# Patient Record
Sex: Male | Born: 1960 | Race: White | Hispanic: No | Marital: Married | Smoking: Never smoker
Health system: Southern US, Community
[De-identification: ages and names within clinical notes are randomized; demographics above are authoritative.]

## PROBLEM LIST (undated history)

## (undated) DIAGNOSIS — E785 Hyperlipidemia, unspecified: Secondary | ICD-10-CM

## (undated) DIAGNOSIS — K219 Gastro-esophageal reflux disease without esophagitis: Secondary | ICD-10-CM

## (undated) HISTORY — DX: Hyperlipidemia, unspecified: E78.5

## (undated) HISTORY — PX: PATELLAR TENDON REPAIR: SHX737

## (undated) HISTORY — PX: OTHER SURGICAL HISTORY: SHX169

## (undated) HISTORY — DX: Gastro-esophageal reflux disease without esophagitis: K21.9

---

## 2005-08-30 ENCOUNTER — Ambulatory Visit: Payer: Self-pay | Admitting: Family Medicine

## 2005-09-08 ENCOUNTER — Ambulatory Visit: Payer: Self-pay | Admitting: Family Medicine

## 2007-02-15 ENCOUNTER — Emergency Department (HOSPITAL_COMMUNITY): Admission: EM | Admit: 2007-02-15 | Discharge: 2007-02-15 | Payer: Self-pay | Admitting: Pediatrics

## 2011-05-08 ENCOUNTER — Emergency Department (HOSPITAL_COMMUNITY): Payer: Self-pay

## 2011-05-08 ENCOUNTER — Observation Stay (HOSPITAL_COMMUNITY)
Admission: EM | Admit: 2011-05-08 | Discharge: 2011-05-08 | Disposition: A | Discharge status: Home / Self Care | Payer: Self-pay | Attending: Emergency Medicine | Admitting: Emergency Medicine

## 2011-05-08 DIAGNOSIS — M5430 Sciatica, unspecified side: Secondary | ICD-10-CM | POA: Insufficient documentation

## 2011-05-08 DIAGNOSIS — M545 Low back pain, unspecified: Principal | ICD-10-CM | POA: Insufficient documentation

## 2011-05-08 DIAGNOSIS — IMO0002 Reserved for concepts with insufficient information to code with codable children: Secondary | ICD-10-CM | POA: Insufficient documentation

## 2014-02-14 ENCOUNTER — Encounter: Payer: Self-pay | Admitting: Internal Medicine

## 2014-02-14 ENCOUNTER — Ambulatory Visit (INDEPENDENT_AMBULATORY_CARE_PROVIDER_SITE_OTHER): Payer: Self-pay | Admitting: Internal Medicine

## 2014-02-14 VITALS — BP 134/85 | HR 72 | Temp 98.3°F | Wt 189.0 lb

## 2014-02-14 DIAGNOSIS — L0390 Cellulitis, unspecified: Secondary | ICD-10-CM

## 2014-02-14 DIAGNOSIS — E785 Hyperlipidemia, unspecified: Secondary | ICD-10-CM

## 2014-02-14 DIAGNOSIS — K219 Gastro-esophageal reflux disease without esophagitis: Secondary | ICD-10-CM | POA: Insufficient documentation

## 2014-02-14 DIAGNOSIS — L0291 Cutaneous abscess, unspecified: Secondary | ICD-10-CM

## 2014-02-14 MED ORDER — DOXYCYCLINE HYCLATE 100 MG PO TABS: 100.0000 mg | ORAL_TABLET | Freq: Two times a day (BID) | ORAL | Status: DC

## 2014-02-14 MED ORDER — MUPIROCIN 2 % EX OINT: 1.0000 "application " | TOPICAL_OINTMENT | Freq: Two times a day (BID) | CUTANEOUS | Status: DC

## 2014-02-14 NOTE — Patient Instructions (Signed)
Take antibiotics as prescribed   Use CHLOREXIDINE 2 OR 4 %:  apply to wet skin daily for 7 days;  from the neck to toes particularly at the skin folds, rinse after 1 minute (shower)  Mupirocin nasal ointment twice a day for one week  Apply OTC hydrocortisone as needed and itchy skin  Call anytime if you've worse, have fever chills or you are not improving in the next 2 or 3 days

## 2014-02-14 NOTE — Progress Notes (Signed)
   Subjective:    Patient ID: Lee Beltran, male    DOB: 1961-09-25, 53 y.o.   MRN: [id number]  DOS:  02/14/2014 Type of  visit: New patient, acute visit, has a CPX schedule for a later time Has a history of boils, usually once a month, recently had one close to the scrotum, it came to a head 4 days ago and is feeling better however 2 days ago developed another in the lower abdomen and he is concerned. Also has developed mild itching at the suprapubic area bilaterally  ROS Denies fever or chills No  nausea, vomiting. Occasional diarrhea which is a chronic problem, no blood in the stools.  Past Medical History  Diagnosis Date  . GERD (gastroesophageal reflux disease)   . Hyperlipidemia     Past Surgical History  Procedure Laterality Date  . Patellar tendon repair    . Face fracture  1990s    has a plate R cheek    History   Social History  . Marital Status: Married    Spouse Name: N/A    Number of Children: 1  . Years of Education: N/A   Occupational History  . logger     Social History Main Topics  . Smoking status: Never Smoker   . Smokeless tobacco: Never Used  . Alcohol Use: No  . Drug Use: No  . Sexual Activity: Not on file   Other Topics Concern  . Not on file   Social History Narrative  . No narrative on file     Family History  Problem Relation Age of Onset  . CAD Father     F MI age 54, M defibrilator   . Diabetes Brother      B and mother   . Breast cancer Sister   . Esophageal cancer Brother       Medication List       This list is accurate as of: 02/14/14 11:59 PM.  Always use your most recent med list.               doxycycline 100 MG tablet  Commonly known as:  VIBRA-TABS  Take 1 tablet (100 mg total) by mouth 2 (two) times daily.     mupirocin ointment 2 %  Commonly known as:  BACTROBAN  Place 1 application into the nose 2 (two) times daily.           Objective:   Physical Exam  Abdominal:     BP 134/85  Pulse 72   Temp(Src) 98.3 F (36.8 C)  Wt 189 lb (85.73 kg)  SpO2 97%  General -- alert, well-developed, NAD.   Lungs -- normal respiratory effort, no intercostal retractions, no accessory muscle use, and normal breath sounds.  Heart-- normal rate, regular rhythm, no murmur.  Abdomen-- Not distended, good bowel sounds,soft, non-tender. Skin--see graphic. Also at the suprapubic area there is a very mild superficial redness without obvious abscess or cellulitis. GU-- Has a 4 mm induration between the scrotum and rectum were the previous boil was, no discharge, redness   Extremities-- no pretibial edema bilaterally  Neurologic--  alert & oriented X3. Speech normal, gait normal, strength normal in all extremities.  Psych-- Cognition and judgment appear intact. Cooperative with normal attention span and concentration. No anxious or depressed appearing.       Assessment & Plan:

## 2014-02-14 NOTE — Progress Notes (Signed)
Pre visit review using our clinic review tool, if applicable. No additional management support is needed unless otherwise documented below in the visit note. 

## 2014-02-15 ENCOUNTER — Encounter: Payer: Self-pay | Admitting: Internal Medicine

## 2014-02-15 DIAGNOSIS — L0390 Cellulitis, unspecified: Secondary | ICD-10-CM | POA: Insufficient documentation

## 2014-02-15 NOTE — Assessment & Plan Note (Signed)
Cellulitis, Presents with localized cellulitis at the abdomen, history of on and off boils for the last year. Vascular exam is normal, no murmur, not running fever. Plan: Doxycycline Suspect MRSA, Will try to eradicate it. See instructions

## 2014-04-04 ENCOUNTER — Ambulatory Visit: Payer: Self-pay | Admitting: Internal Medicine

## 2014-04-14 ENCOUNTER — Telehealth: Payer: Self-pay | Admitting: *Deleted

## 2014-04-14 NOTE — Telephone Encounter (Signed)
Please call pt wife, Kendal HymenBonnie at [phone].  bw

## 2014-04-14 NOTE — Telephone Encounter (Signed)
Medication List and allergies:  Reviewed and updated   Local prescriptions: [facility] [address] [place]  Immunizations Due: Tdap/Pna  A/P FH, PSH, or Personal Hx: Reviewed and updated Flu vaccine: Does not take flu vaccine Tdap:Unable to remember last one PNA: Never Shingles: Never CCS: Never PSA: Never  To Discuss with Provider: Rash on his face

## 2014-04-14 NOTE — Telephone Encounter (Signed)
Left message for the patient to return my call regarding upcoming appt.

## 2014-04-15 ENCOUNTER — Encounter: Payer: Self-pay | Admitting: Internal Medicine

## 2014-04-15 ENCOUNTER — Ambulatory Visit (INDEPENDENT_AMBULATORY_CARE_PROVIDER_SITE_OTHER): Payer: Self-pay | Admitting: Internal Medicine

## 2014-04-15 VITALS — BP 145/79 | HR 68 | Temp 98.7°F | Resp 16 | Ht 71.25 in | Wt 190.5 lb

## 2014-04-15 DIAGNOSIS — L03319 Cellulitis of trunk, unspecified: Secondary | ICD-10-CM

## 2014-04-15 DIAGNOSIS — Z23 Encounter for immunization: Secondary | ICD-10-CM

## 2014-04-15 DIAGNOSIS — Z0000 Encounter for general adult medical examination without abnormal findings: Secondary | ICD-10-CM

## 2014-04-15 MED ORDER — TRIAMCINOLONE ACETONIDE 0.1 % EX LOTN: 1.0000 "application " | TOPICAL_LOTION | Freq: Two times a day (BID) | CUTANEOUS | Status: DC | PRN

## 2014-04-15 NOTE — Patient Instructions (Signed)
Please come back fasting: CMP, CBC, TSH, FLP, PSA --- dx v70  Next visit in one year

## 2014-04-15 NOTE — Progress Notes (Signed)
Subjective:    Patient ID: Lee Beltran, male    DOB: 08-07-1961, 53 y.o.   MRN: [id number]  DOS:  04/15/2014 Type of  Visit: CPX History: feeling well   ROS Diet-- healthy for the most part, no fast food  Exercise-- very active at work No  CP, SOB No palpitations  Denies  nausea, vomiting diarrhea, blood in the stools  (-) cough, sputum production (-) wheezing, chest congestion No dysuria, gross hematuria, difficulty urinating  + stress, occ  anxiety, no depression    Past Medical History  Diagnosis Date  . GERD (gastroesophageal reflux disease)   . Hyperlipidemia     Past Surgical History  Procedure Laterality Date  . Patellar tendon repair    . Face fracture  1990s    has a plate R cheek    History   Social History  . Marital Status: Married    Spouse Name: N/A    Number of Children: 1  . Years of Education: N/A   Occupational History  . logger     Social History Main Topics  . Smoking status: Never Smoker   . Smokeless tobacco: Never Used  . Alcohol Use: No  . Drug Use: No  . Sexual Activity: Not on file   Other Topics Concern  . Not on file   Social History Narrative  . No narrative on file     Family History  Problem Relation Age of Onset  . CAD Father     F MI age 54, M defibrilator   . Diabetes Brother      B and mother   . Breast cancer Sister   . Esophageal cancer Brother   . Colon cancer Neg Hx   . Prostate cancer Neg Hx        Medication List       This list is accurate as of: 04/15/14  5:48 PM.  Always use your most recent med list.               CHOLESTEROL DEFENSE PO  Take 1 tablet by mouth daily.     HEALTHY HEART COMPLEX PO  Take 1 tablet by mouth daily.     multivitamin with minerals tablet  Take 1 tablet by mouth daily.     triamcinolone lotion 0.1 %  Commonly known as:  KENALOG  Apply 1 application topically 2 (two) times daily as needed.           Objective:   Physical Exam BP 145/79  Pulse 68   Temp(Src) 98.7 F (37.1 C) (Oral)  Resp 16  Ht 5' 11.25" (1.81 m)  Wt 190 lb 8 oz (86.41 kg)  BMI 26.38 kg/m2  SpO2 97% General -- alert, well-developed, NAD.  Neck --no thyromegaly , normal carotid pulse  HEENT-- Not pale.  Lungs -- normal respiratory effort, no intercostal retractions, no accessory muscle use, and normal breath sounds.  Heart-- normal rate, regular rhythm, no murmur.  Abdomen-- Not distended, good bowel sounds,soft, non-tender. No rebound or rigidity. No mass,organomegaly. Rectal-- No external abnormalities noted. Normal sphincter tone. No rectal masses or tenderness. No stool found Prostate--Prostate gland firm and smooth, no enlargement, nodularity, tenderness, mass, asymmetry or induration. Extremities-- no pretibial edema bilaterally  Neurologic--  alert & oriented X3. Speech normal, gait appropriate for age, strength symmetric and appropriate for age.   Psych-- Cognition and judgment appear intact. Cooperative with normal attention span and concentration. No anxious or depressed appearing.  Assessment & Plan:

## 2014-04-15 NOTE — Progress Notes (Signed)
Pre visit review using our clinic review tool, if applicable. No additional management support is needed unless otherwise documented below in the visit note. 

## 2014-04-15 NOTE — Assessment & Plan Note (Signed)
Resolved

## 2014-04-15 NOTE — Assessment & Plan Note (Addendum)
Td today +FH oc CAD, Patient is asymptomatic, plan is control his CV risk factors. EKG--Unable to obtain, machine  down. Will get EKG when he comes back for blood work Diet and exercise discussed Labs  Never had a cscope , discussed cscope vs IFOB, elected IFOB, will call if decides to do a Cscope  Also, having itching around the neck, related to sweating? Will prescribe Kenalog lotion to use as needed. If not better will let me know

## 2014-04-18 ENCOUNTER — Other Ambulatory Visit: Payer: Self-pay | Admitting: Internal Medicine

## 2014-04-18 ENCOUNTER — Other Ambulatory Visit (INDEPENDENT_AMBULATORY_CARE_PROVIDER_SITE_OTHER): Payer: Self-pay

## 2014-04-18 ENCOUNTER — Ambulatory Visit: Payer: Self-pay

## 2014-04-18 DIAGNOSIS — Z0000 Encounter for general adult medical examination without abnormal findings: Secondary | ICD-10-CM

## 2014-04-18 LAB — CBC
HCT: 40.5 % (ref 39.0–52.0)
Hemoglobin: 14.2 g/dL (ref 13.0–17.0)
MCHC: 35 g/dL (ref 30.0–36.0)
MCV: 89 fl (ref 78.0–100.0)
Platelets: 209 K/uL (ref 150.0–400.0)
RBC: 4.55 Mil/uL (ref 4.22–5.81)
RDW: 13.1 % (ref 11.5–15.5)
WBC: 6.4 K/uL (ref 4.0–10.5)

## 2014-04-18 LAB — LIPID PANEL
Cholesterol: 220 mg/dL — ABNORMAL HIGH (ref 0–200)
HDL: 29 mg/dL — ABNORMAL LOW
LDL Cholesterol: 27 mg/dL (ref 0–99)
NonHDL: 191
Total CHOL/HDL Ratio: 8
Triglycerides: 822 mg/dL — ABNORMAL HIGH (ref 0.0–149.0)
VLDL: 164.4 mg/dL — ABNORMAL HIGH (ref 0.0–40.0)

## 2014-04-18 LAB — COMPREHENSIVE METABOLIC PANEL
ALK PHOS: 62 U/L (ref 39–117)
ALT: 31 U/L (ref 0–53)
AST: 27 U/L (ref 0–37)
Albumin: 4.2 g/dL (ref 3.5–5.2)
BILIRUBIN TOTAL: 0.5 mg/dL (ref 0.2–1.2)
BUN: 20 mg/dL (ref 6–23)
CO2: 27 mEq/L (ref 19–32)
Calcium: 9.1 mg/dL (ref 8.4–10.5)
Chloride: 106 mEq/L (ref 96–112)
Creatinine, Ser: 1 mg/dL (ref 0.4–1.5)
GFR: 84.88 mL/min (ref >60.00)
GLUCOSE: 92 mg/dL (ref 70–99)
Potassium: 4.2 mEq/L (ref 3.5–5.1)
SODIUM: 138 meq/L (ref 135–145)
Total Protein: 7.5 g/dL (ref 6.0–8.3)

## 2014-04-18 LAB — TSH: TSH: 1.62 u[IU]/mL (ref 0.35–4.50)

## 2014-04-18 LAB — PSA: PSA: 0.79 ng/mL (ref 0.10–4.00)

## 2014-04-18 NOTE — Patient Instructions (Signed)
Patient in the office for EKG, machine was down at the time of CPE.

## 2014-04-22 ENCOUNTER — Other Ambulatory Visit: Payer: Self-pay | Admitting: General Practice

## 2014-04-22 DIAGNOSIS — E785 Hyperlipidemia, unspecified: Secondary | ICD-10-CM

## 2014-04-22 MED ORDER — FENOFIBRATE 160 MG PO TABS: 160.0000 mg | ORAL_TABLET | Freq: Every day | ORAL | Status: DC

## 2014-04-29 ENCOUNTER — Other Ambulatory Visit (INDEPENDENT_AMBULATORY_CARE_PROVIDER_SITE_OTHER): Payer: Self-pay

## 2014-04-29 DIAGNOSIS — Z0000 Encounter for general adult medical examination without abnormal findings: Secondary | ICD-10-CM

## 2014-04-29 LAB — FECAL OCCULT BLOOD, IMMUNOCHEMICAL: FECAL OCCULT BLD: NEGATIVE

## 2014-04-30 ENCOUNTER — Encounter: Payer: Self-pay | Admitting: *Deleted

## 2014-07-27 ENCOUNTER — Telehealth: Payer: Self-pay | Admitting: Internal Medicine

## 2014-07-27 DIAGNOSIS — E785 Hyperlipidemia, unspecified: Secondary | ICD-10-CM

## 2014-07-27 NOTE — Telephone Encounter (Signed)
Call  patient, due for labs, recently started a cholesterol medication. Arrange for at FLP, AST, ALT----------- dx  dyslipidemia

## 2014-07-28 NOTE — Telephone Encounter (Signed)
Spoke with Pts wife, informed her that Pt needs FLP, AST, and ALT drawn. She stated she would have Pt call and make appt.

## 2014-08-12 NOTE — Telephone Encounter (Signed)
Lab appt has not been scheduled, letter printed and mailed to Pt informing him to call and schedule lab appt at his earliest convenience.

## 2015-04-10 ENCOUNTER — Telehealth: Payer: Self-pay | Admitting: Internal Medicine

## 2015-04-10 NOTE — Telephone Encounter (Signed)
Please advise 

## 2015-04-10 NOTE — Telephone Encounter (Signed)
Spoke with Kendal Hymen, Pt's wife, informed her of Dr. Drue Novel recommendations. Informed Kendal Hymen that our [place] location has Saturday clinic's from either 9 to 12 or 9 to 1, gave her their number to call in the morning to schedule an acute visit. Kendal Hymen verbalized understanding.

## 2015-04-10 NOTE — Telephone Encounter (Signed)
Caller name: Domenik Rumpel Relationship to patient: wife Can be reached: [phone], cell # for [facility]:  Reason for call: Wants to know if she can put castor oil on boils pt has near his genitals. Pt is not wanting to take time to come in for an appt. 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 Has about 6 large boils, quarter size per his wife. States previously prescribed antibiotic for it and understand one will not be called in without having a visit.

## 2015-04-10 NOTE — Telephone Encounter (Signed)
Recommend to be seen for treatment either at one of our offices or at the urgent care. Recommend warm compress and avoid castor oil

## 2015-07-13 ENCOUNTER — Ambulatory Visit (INDEPENDENT_AMBULATORY_CARE_PROVIDER_SITE_OTHER): Payer: Self-pay

## 2015-07-13 ENCOUNTER — Encounter: Payer: Self-pay | Admitting: Podiatry

## 2015-07-13 ENCOUNTER — Ambulatory Visit (INDEPENDENT_AMBULATORY_CARE_PROVIDER_SITE_OTHER): Payer: Self-pay | Admitting: Podiatry

## 2015-07-13 VITALS — BP 152/89 | HR 75 | Resp 16 | Ht 70.0 in | Wt 185.0 lb

## 2015-07-13 DIAGNOSIS — M79675 Pain in left toe(s): Secondary | ICD-10-CM

## 2015-07-13 DIAGNOSIS — L600 Ingrowing nail: Secondary | ICD-10-CM

## 2015-07-13 NOTE — Progress Notes (Signed)
   Subjective:    Patient ID: Lee Beltran, male    DOB: 30-Jul-1961, 54 y.o.   MRN: [id number]  HPI Patient presents with a nail problem in their left foot, pinky toe. Left foot, Possibly ingrown. Pt has soaked toe in salt water with no relief. This has been going on for the past 2 weeks. Pt stated, toe nail "oozed in the first week".   Review of Systems  All other systems reviewed and are negative.      Objective:   Physical Exam        Assessment & Plan:

## 2015-07-13 NOTE — Patient Instructions (Signed)

## 2015-07-13 NOTE — Progress Notes (Signed)
Subjective:     Patient ID: Lee Beltran, male   DOB: 1961/02/12, 54 y.o.   MRN: [id number]  HPI patient presents stating my left little toe has been very tender and making it hard for me to wear shoe gear and I don't remember an injury   Review of Systems  All other systems reviewed and are negative.      Objective:   Physical Exam  Constitutional: He is oriented to person, place, and time.  Cardiovascular: Intact distal pulses.   Musculoskeletal: Normal range of motion.  Neurological: He is oriented to person, place, and time.  Skin: Skin is warm.  Nursing note and vitals reviewed.  her vascular status intact muscle strength adequate range of motion within normal limits with patient noted to have a painful distal lateral aspect of the fifth toe left with both keratotic lesion and incurvated nailbed noted. Also rotation of the fifth toe is noted and I did see that the patient has good digital perfusion and is well oriented 3     Assessment:     Rotated fifth toe with distal lateral keratotic lesion and ingrown toenail that's very painful when pressed    Plan:     H&P condition reviewed with patient. At this point it's difficult to say between a problem of a lesion or a problem of nail and I explained both conditions to patient. I went ahead and first infiltrated 60 mg I can Marcaine mixture and then x-rayed the foot and did a sterile prep. I then remove the lateral nail bed exposed the matrix and applied phenol and debrided tissue and do think for the most part this appears to be nail condition even though there is a small bone spur. Reviewed x-rays with patient

## 2015-07-15 ENCOUNTER — Telehealth: Payer: Self-pay | Admitting: *Deleted

## 2015-07-15 NOTE — Telephone Encounter (Signed)
Called patient at [phone] (Home #) and talked to the patient's wife, Lee Beltran, regarding her husband's ingrown toenail procedure that was performed on Monday, July 13, 2015. She stated, "Toe felt fine". Pt has soaked his toe. Pt can't wear a regular shoe yet.

## 2016-10-19 ENCOUNTER — Ambulatory Visit (INDEPENDENT_AMBULATORY_CARE_PROVIDER_SITE_OTHER): Payer: Self-pay

## 2016-10-19 ENCOUNTER — Encounter (HOSPITAL_COMMUNITY): Payer: Self-pay | Admitting: Emergency Medicine

## 2016-10-19 ENCOUNTER — Ambulatory Visit (HOSPITAL_COMMUNITY)
Admission: EM | Admit: 2016-10-19 | Discharge: 2016-10-19 | Disposition: A | Discharge status: Home / Self Care | Payer: Self-pay | Attending: Family Medicine | Admitting: Family Medicine

## 2016-10-19 DIAGNOSIS — B9789 Other viral agents as the cause of diseases classified elsewhere: Secondary | ICD-10-CM

## 2016-10-19 DIAGNOSIS — J069 Acute upper respiratory infection, unspecified: Secondary | ICD-10-CM

## 2016-10-19 MED ORDER — IPRATROPIUM BROMIDE 0.06 % NA SOLN: 2.0000 | Freq: Four times a day (QID) | NASAL | 1 refills | Status: AC

## 2016-10-19 MED ORDER — HYDROCOD POLST-CPM POLST ER 10-8 MG/5ML PO SUER: 5.0000 mL | Freq: Two times a day (BID) | ORAL | 0 refills | Status: DC | PRN

## 2016-10-19 NOTE — ED Provider Notes (Signed)
[facility]    CSN: [id number] Arrival date & time: 10/19/16  1733     History   Chief Complaint Chief Complaint  Patient presents with  . Cough    HPI Lee Beltran is a 55 y.o. male.   The history is provided by the patient and the spouse.  Cough  Cough characteristics:  Non-productive and hacking Severity:  Moderate Onset quality:  Gradual Duration:  4 days Progression:  Unchanged Chronicity:  New Smoker: no   Context: sick contacts   Context comment:  Grandson with RSV pneumonia. Relieved by:  Nothing Associated symptoms: fever and sore throat   Associated symptoms: no rhinorrhea and no shortness of breath     Past Medical History:  Diagnosis Date  . GERD (gastroesophageal reflux disease)   . Hyperlipidemia     Patient Active Problem List   Diagnosis Date Noted  . Annual physical exam 04/15/2014  . Cellulitis 02/15/2014  . GERD (gastroesophageal reflux disease)   . Hyperlipidemia     Past Surgical History:  Procedure Laterality Date  . face fracture  1990s   has a plate R cheek  . PATELLAR TENDON REPAIR         Home Medications    Prior to Admission medications   Medication Sig Start Date End Date Taking? Authorizing Provider  chlorpheniramine-HYDROcodone (TUSSIONEX PENNKINETIC ER) 10-8 MG/5ML SUER Take 5 mLs by mouth every 12 (twelve) hours as needed for cough. 10/19/16   Linna HoffJames D Kema Santaella, MD  ipratropium (ATROVENT) 0.06 % nasal spray Place 2 sprays into both nostrils 4 (four) times daily. 10/19/16   Linna HoffJames D Madhuri Vacca, MD  Nutritional Supplements (CHOLESTEROL DEFENSE PO) Take 1 tablet by mouth daily.    Historical Provider, MD  Specialty Vitamins Products (HEALTHY HEART COMPLEX PO) Take 1 tablet by mouth daily.    Historical Provider, MD  UNABLE TO FIND Pt takes Shaklee, Nutriferon, 1 tablet a day-nutritional supplement    Historical Provider, MD    Family History Family History  Problem Relation Age of Onset  . CAD Father     F MI age  56, M defibrilator   . Diabetes Brother      B and mother   . Breast cancer Sister   . Esophageal cancer Brother   . Colon cancer Neg Hx   . Prostate cancer Neg Hx     Social History Social History  Substance Use Topics  . Smoking status: Never Smoker  . Smokeless tobacco: Never Used  . Alcohol use No     Allergies   Patient has no known allergies.   Review of Systems Review of Systems  Constitutional: Positive for fever.  HENT: Positive for sore throat. Negative for congestion, postnasal drip and rhinorrhea.   Respiratory: Positive for cough. Negative for shortness of breath.   Genitourinary: Negative.   Neurological: Negative.   All other systems reviewed and are negative.    Physical Exam Triage Vital Signs ED Triage Vitals  Enc Vitals Group     BP 10/19/16 1820 141/91     Pulse Rate 10/19/16 1820 93     Resp 10/19/16 1820 26     Temp 10/19/16 1820 99.6 F (37.6 C)     Temp Source 10/19/16 1820 Oral     SpO2 10/19/16 1820 97 %     Weight 10/19/16 1819 185 lb (83.9 kg)     Height 10/19/16 1819 5\' 11"  (1.803 m)     Head Circumference --  Peak Flow --      Pain Score 10/19/16 1821 7     Pain Loc --      Pain Edu? --      Excl. in GC? --    No data found.   Updated Vital Signs BP 141/91   Pulse 93   Temp 99.6 F (37.6 C) (Oral)   Resp 26   Ht 5\' 11"  (1.803 m)   Wt 185 lb (83.9 kg)   SpO2 97%   BMI 25.80 kg/m   Visual Acuity Right Eye Distance:   Left Eye Distance:   Bilateral Distance:    Right Eye Near:   Left Eye Near:    Bilateral Near:     Physical Exam  Constitutional: He is oriented to person, place, and time. He appears well-developed and well-nourished.  HENT:  Right Ear: External ear normal.  Left Ear: External ear normal.  Mouth/Throat: Oropharynx is clear and moist.  Cardiovascular: Normal rate.   Pulmonary/Chest: Effort normal and breath sounds normal.  Lymphadenopathy:    He has no cervical adenopathy.    Neurological: He is alert and oriented to person, place, and time.  Skin: Skin is warm and dry.  Nursing note and vitals reviewed.    UC Treatments / Results  Labs (all labs ordered are listed, but only abnormal results are displayed) Labs Reviewed - No data to display  EKG  EKG Interpretation None       Radiology Dg Chest 2 View  Result Date: 10/19/2016 CLINICAL DATA:  Cough and fever EXAM: CHEST  2 VIEW COMPARISON:  None. FINDINGS: The heart size and mediastinal contours are within normal limits. Both lungs are clear. Nodular density at the left lung base consistent with the patient's left nipple shadow. This can be correlated with repeat imaging of the chest with nipple markers in place. The visualized skeletal structures are unremarkable. IMPRESSION: No active cardiopulmonary disease. Probable left basilar nipple shadow. This can be correlated with repeat frontal view of the chest with nipple markers in place. Electronically Signed   By: Tollie Ethavid  Kwon M.D.   On: 10/19/2016 19:04   X-rays reviewed and report per radiologist.  Procedures Procedures (including critical care time)  Medications Ordered in UC Medications - No data to display   Initial Impression / Assessment and Plan / UC Course  I have reviewed the triage vital signs and the nursing notes.  Pertinent labs & imaging results that were available during my care of the patient were reviewed by me and considered in my medical decision making (see chart for details).  Clinical Course       Final Clinical Impressions(s) / UC Diagnoses   Final diagnoses:  Viral URI with cough    New Prescriptions New Prescriptions   CHLORPHENIRAMINE-HYDROCODONE (TUSSIONEX PENNKINETIC ER) 10-8 MG/5ML SUER    Take 5 mLs by mouth every 12 (twelve) hours as needed for cough.   IPRATROPIUM (ATROVENT) 0.06 % NASAL SPRAY    Place 2 sprays into both nostrils 4 (four) times daily.     Linna HoffJames D Annalea Alguire, MD 10/19/16 [phone]

## 2016-10-19 NOTE — Discharge Instructions (Signed)
Drink plenty of fluids as discussed, use medicine as prescribed, and mucinex or delsym for cough. Return or see your doctor if further problems °

## 2016-10-19 NOTE — ED Notes (Signed)
Dr. Artis FlockKindl aware of initial vitals and approves DC

## 2016-10-19 NOTE — ED Triage Notes (Signed)
PT reports non productive cough, SOB, sore throat, and fevers for 3-4 days. PT was exposed to grandson who has RSV and pneumonia

## 2016-10-23 NOTE — ED Notes (Signed)
Spoke with wife and she stated husband isn't getting better, the cough medication doesn't even last an hour and he is not sleeping. I told her he would have to be reassessed and let her know the wait was pretty long for today, she was okay with this and said she would come in with him tomorrow as soon as we open.

## 2017-10-06 ENCOUNTER — Ambulatory Visit: Payer: Self-pay | Admitting: Family Medicine

## 2017-10-06 ENCOUNTER — Encounter: Payer: Self-pay | Admitting: Family Medicine

## 2017-10-06 VITALS — BP 134/68 | HR 87 | Temp 98.6°F | Wt 191.2 lb

## 2017-10-06 DIAGNOSIS — R059 Cough, unspecified: Secondary | ICD-10-CM

## 2017-10-06 DIAGNOSIS — J069 Acute upper respiratory infection, unspecified: Secondary | ICD-10-CM

## 2017-10-06 DIAGNOSIS — R05 Cough: Secondary | ICD-10-CM

## 2017-10-06 DIAGNOSIS — B9789 Other viral agents as the cause of diseases classified elsewhere: Secondary | ICD-10-CM

## 2017-10-06 MED ORDER — PREDNISONE 20 MG PO TABS: 20.0000 mg | ORAL_TABLET | Freq: Every day | ORAL | 0 refills | Status: DC

## 2017-10-06 MED ORDER — IPRATROPIUM BROMIDE 0.02 % IN SOLN
0.5000 mg | Freq: Once | RESPIRATORY_TRACT | Status: AC
  Administered 2017-10-06: 0.5 mg via RESPIRATORY_TRACT

## 2017-10-06 MED ORDER — ALBUTEROL SULFATE (2.5 MG/3ML) 0.083% IN NEBU
2.5000 mg | INHALATION_SOLUTION | Freq: Once | RESPIRATORY_TRACT | Status: AC
  Administered 2017-10-06: 2.5 mg via RESPIRATORY_TRACT

## 2017-10-06 MED ORDER — HYDROCOD POLST-CPM POLST ER 10-8 MG/5ML PO SUER: 5.0000 mL | Freq: Two times a day (BID) | ORAL | 0 refills | Status: DC | PRN

## 2017-10-06 NOTE — Progress Notes (Signed)
Subjective:    Patient ID: Lee BillingsDonny W [place], male    DOB: 1961/05/10, 56 y.o.   MRN: [id number]  HPI This is a 56 yo male who presents today with cough x 2 weeks. Cough is dry, worse at night. No headache, ear ache, sore throat, or SOB. Wife reports hearing wheezing and touching his head and knowing he has a 101 temp (not checked with thermometer). Doesn't feel badly. Has tried multiple OTC cough suppressents without relief. Was around sick grandson prior to symptoms starting.  Had similar episode last year after being around sick grandchild.   Past Medical History:  Diagnosis Date  . GERD (gastroesophageal reflux disease)   . Hyperlipidemia    Past Surgical History:  Procedure Laterality Date  . face fracture  1990s   has a plate R cheek  . PATELLAR TENDON REPAIR     Family History  Problem Relation Age of Onset  . CAD Father        F MI age 57, M defibrilator   . Diabetes Brother         B and mother   . Breast cancer Sister   . Esophageal cancer Brother   . Colon cancer Neg Hx   . Prostate cancer Neg Hx    Social History   Tobacco Use  . Smoking status: Never Smoker  . Smokeless tobacco: Never Used  Substance Use Topics  . Alcohol use: No  . Drug use: No     Review of Systems Per HPI    Objective:   Physical Exam  Constitutional: He is oriented to person, place, and time. He appears well-developed and well-nourished. No distress.  HENT:  Head: Normocephalic and atraumatic.  Right Ear: External ear normal.  Left Ear: External ear normal.  Nose: Nose normal.  Mouth/Throat: Oropharynx is clear and moist. No oropharyngeal exudate.  Eyes: Conjunctivae are normal.  Neck: Normal range of motion. Neck supple.  Cardiovascular: Normal rate, regular rhythm and normal heart sounds.  Pulmonary/Chest: Effort normal. No respiratory distress. He has no wheezes. He has no rales.  Nearly constant, dry cough. Breath sounds mildly decreased throughout.   Neurological: He is  alert and oriented to person, place, and time.  Skin: Skin is warm and dry. He is not diaphoretic.  Psychiatric: He has a normal mood and affect. His behavior is normal. Judgment and thought content normal.  Vitals reviewed.     BP 134/68 (BP Location: Right Arm, Patient Position: Sitting, Cuff Size: Normal)   Pulse 87   Temp 98.6 F (37 C) (Oral)   Wt 191 lb 4 oz (86.8 kg)   SpO2 95%   BMI 26.67 kg/m  Wt Readings from Last 3 Encounters:  10/06/17 191 lb 4 oz (86.8 kg)  10/19/16 185 lb (83.9 kg)  07/13/15 185 lb (83.9 kg)   Patient given Atrovent/albuterol nebulizer treatment in office.  He had significant subjective and objective improvement.  Cough decreased significantly.  Lung sounds improved, better airflow.  Pulse ox up to 98%.    Assessment & Plan:  1. Cough - albuterol (PROVENTIL) (2.5 MG/3ML) 0.083% nebulizer solution 2.5 mg - ipratropium (ATROVENT) nebulizer solution 0.5 mg  2. Viral URI with cough - Provided written and verbal information regarding diagnosis and treatment. - RTC precautions reviewed - chlorpheniramine-HYDROcodone (TUSSIONEX PENNKINETIC ER) 10-8 MG/5ML SUER; Take 5 mLs by mouth every 12 (twelve) hours as needed for cough.  Dispense: 115 mL; Refill: 0 - predniSONE (DELTASONE) 20 MG tablet; Take  1 tablet (20 mg total) by mouth daily with breakfast.  Dispense: 5 tablet; Refill: 0  -Encouraged him to follow-up with PCP for CPE  Olean Reeeborah Andriy Sherk, FNP-BC  [facility], [facility]  10/08/2017 12:35 PM

## 2017-10-06 NOTE — Patient Instructions (Signed)
For nasal congestion you can use Afrin nasal spray for 3 days max, Sudafed, saline nasal spray (generic is fine for all). Drink enough fluids to make your urine light yellow. For fever/chill/muscle aches you can take over the counter acetaminophen or ibuprofen.  Please come back in if you are not better in 5-7 days or if you develop wheezing, shortness of breath or persistent vomiting.   

## 2017-10-08 ENCOUNTER — Encounter: Payer: Self-pay | Admitting: Family Medicine

## 2017-10-09 ENCOUNTER — Encounter: Payer: Self-pay | Admitting: Family Medicine

## 2017-10-09 ENCOUNTER — Other Ambulatory Visit: Payer: Self-pay | Admitting: Family Medicine

## 2017-10-09 ENCOUNTER — Telehealth: Payer: Self-pay | Admitting: Family Medicine

## 2017-10-09 DIAGNOSIS — R05 Cough: Secondary | ICD-10-CM

## 2017-10-09 DIAGNOSIS — R059 Cough, unspecified: Secondary | ICD-10-CM

## 2017-10-09 MED ORDER — AZITHROMYCIN 250 MG PO TABS: ORAL_TABLET | ORAL | 0 refills | Status: DC

## 2017-10-09 MED ORDER — ALBUTEROL SULFATE HFA 108 (90 BASE) MCG/ACT IN AERS: 2.0000 | INHALATION_SPRAY | RESPIRATORY_TRACT | 1 refills | Status: AC | PRN

## 2017-10-09 NOTE — Telephone Encounter (Signed)
Called and spoke with patient's wife with husband near phone. He continues to cough a great deal during day, chest feels tight. Cough better at night with tussionex- can't take during the day. He got good relief with albuterol/atrovent neb in office, I have sent in albuterol inhaler and Zpack. They were instructed to let me know if not improved in 48 hours, may need to be seen for CXR.

## 2017-10-09 NOTE — Telephone Encounter (Signed)
Copied from CRM [id number]. Topic: Quick Communication - See Telephone Encounter >> Oct 09, 2017 10:40 AM Elliot GaultBell, Tiffany M wrote: CRM for notification. See Telephone encounter for:   10/09/17.  Caller name: Kendal HymenBonnie  Relation to pt: spouse  Call back number:  [phone] / [phone]  [facility] [place] - [address] [phone] (Phone) [phone] (Fax)    Reason for call:  Patient last seen by Dr. Leone PayorGessner on 10/06/17 and states patient experiencing nasal drainage, sinus pressure, cough has improved at night but during the day coughing has not improved, requesting antibiotics,please advise

## 2017-12-29 IMAGING — DX DG CHEST 2V
2 series · 2 of 2 positions shown · non-contrast
Comparison: None.

CLINICAL DATA: Cough and fever

EXAM:
CHEST  2 VIEW

[chest pa]
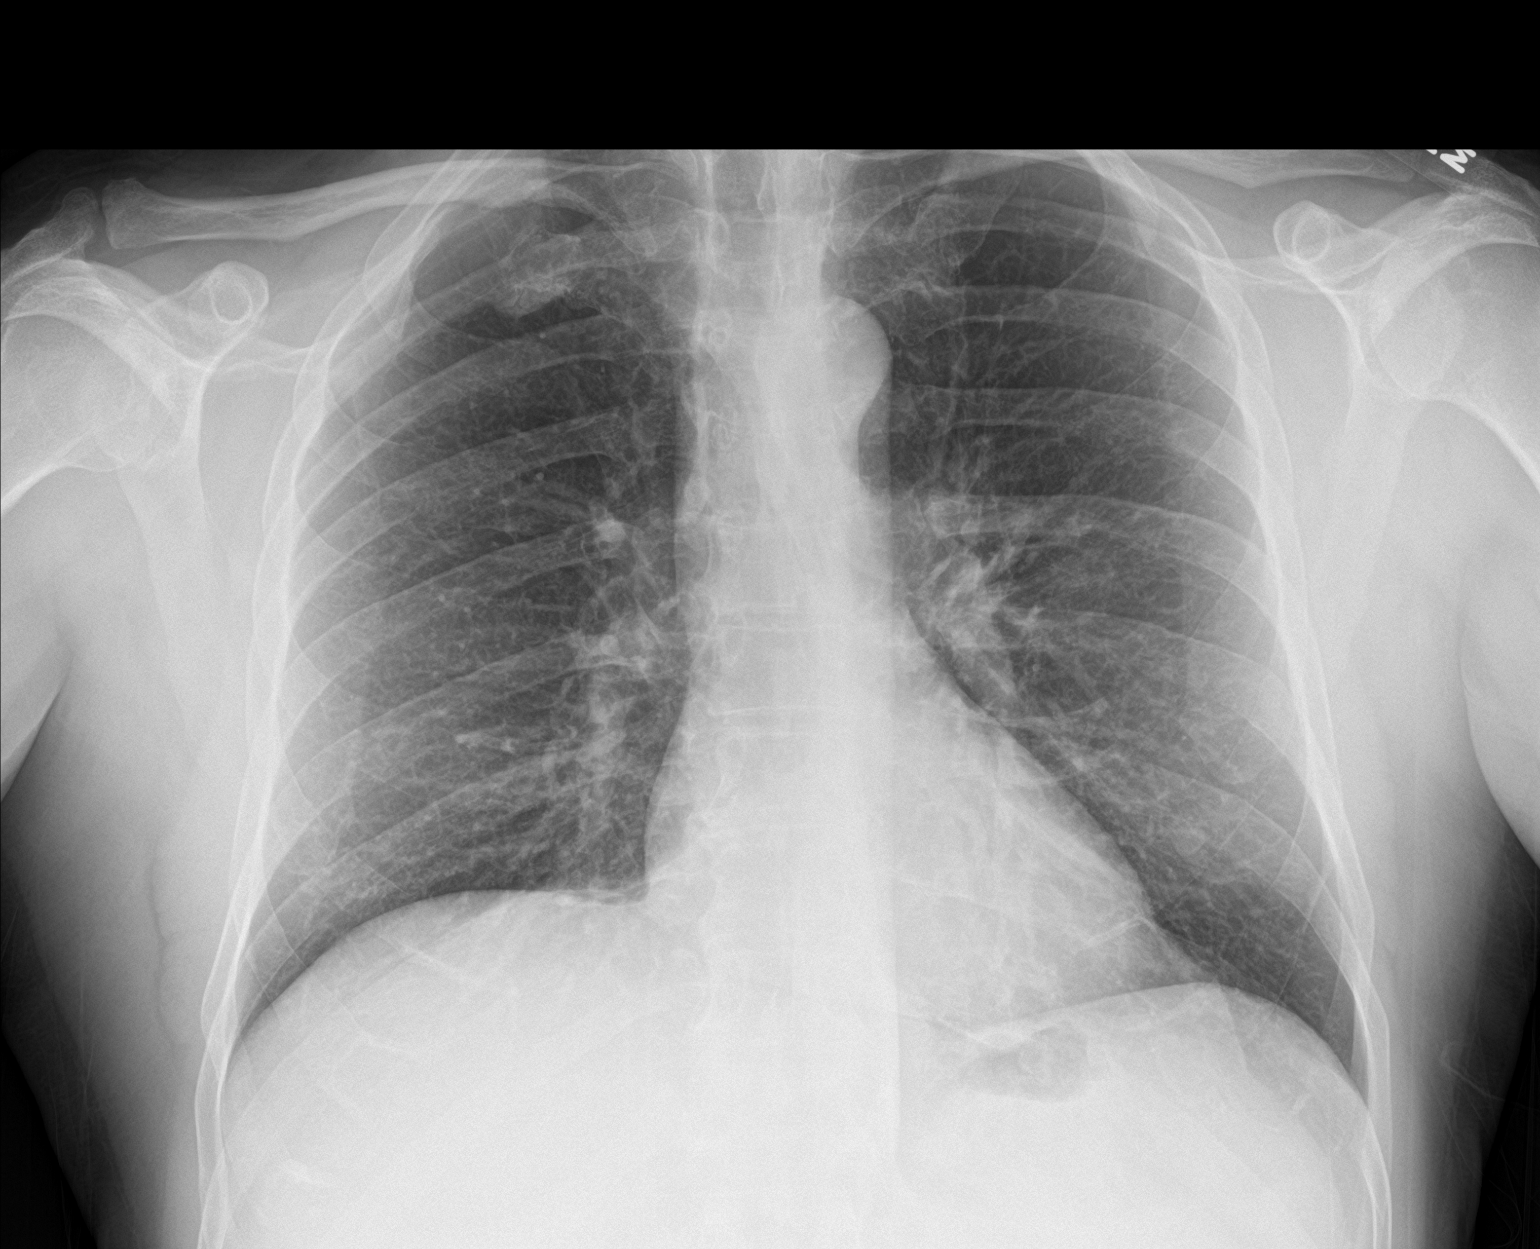

[chest lat]
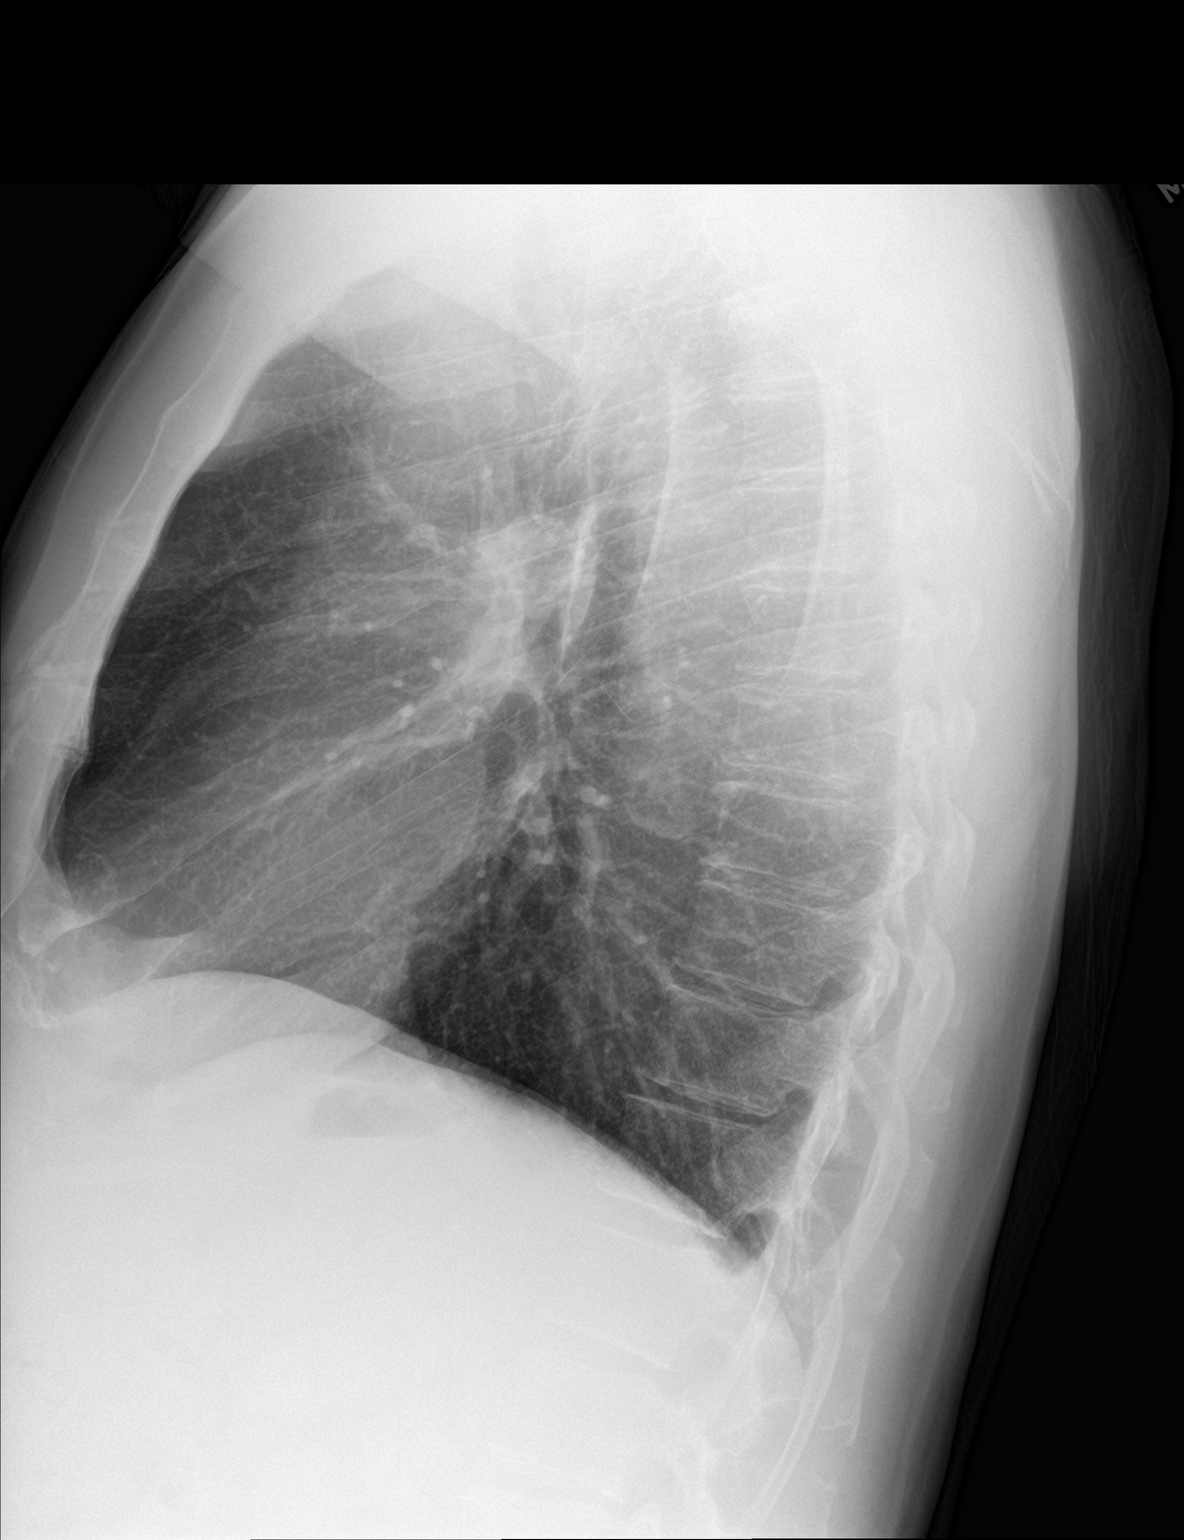

[2 of 2 positions shown; findings below may reference images not displayed]

FINDINGS: The heart size and mediastinal contours are within normal limits.
Both lungs are clear. Nodular density at the left lung base
consistent with the patient's left nipple shadow. This can be
correlated with repeat imaging of the chest with nipple markers in
place. The visualized skeletal structures are unremarkable.
IMPRESSION: No active cardiopulmonary disease. Probable left basilar nipple
shadow. This can be correlated with repeat frontal view of the chest
with nipple markers in place.

## 2018-05-16 ENCOUNTER — Encounter: Payer: Self-pay | Admitting: Family Medicine

## 2018-05-16 ENCOUNTER — Ambulatory Visit: Payer: Self-pay | Admitting: Family Medicine

## 2018-05-16 VITALS — BP 128/82 | HR 61 | Temp 98.4°F | Wt 192.8 lb

## 2018-05-16 DIAGNOSIS — R197 Diarrhea, unspecified: Secondary | ICD-10-CM

## 2018-05-16 DIAGNOSIS — R1030 Lower abdominal pain, unspecified: Secondary | ICD-10-CM

## 2018-05-16 DIAGNOSIS — W57XXXA Bitten or stung by nonvenomous insect and other nonvenomous arthropods, initial encounter: Secondary | ICD-10-CM

## 2018-05-16 DIAGNOSIS — S30861A Insect bite (nonvenomous) of abdominal wall, initial encounter: Secondary | ICD-10-CM

## 2018-05-16 LAB — CBC WITH DIFFERENTIAL/PLATELET
Basophils Absolute: 0 10*3/uL (ref 0.0–0.1)
Basophils Relative: 0.4 % (ref 0.0–3.0)
Eosinophils Absolute: 0.3 10*3/uL (ref 0.0–0.7)
Eosinophils Relative: 4.6 % (ref 0.0–5.0)
HCT: 40.8 % (ref 39.0–52.0)
Hemoglobin: 14.3 g/dL (ref 13.0–17.0)
Lymphocytes Relative: 32.5 % (ref 12.0–46.0)
Lymphs Abs: 2.2 10*3/uL (ref 0.7–4.0)
MCHC: 35.1 g/dL (ref 30.0–36.0)
MCV: 88.4 fl (ref 78.0–100.0)
Monocytes Absolute: 0.7 10*3/uL (ref 0.1–1.0)
Monocytes Relative: 10.6 % (ref 3.0–12.0)
Neutro Abs: 3.4 10*3/uL (ref 1.4–7.7)
Neutrophils Relative %: 51.9 % (ref 43.0–77.0)
Platelets: 209 10*3/uL (ref 150.0–400.0)
RBC: 4.62 Mil/uL (ref 4.22–5.81)
RDW: 13.3 % (ref 11.5–15.5)
WBC: 6.6 10*3/uL (ref 4.0–10.5)

## 2018-05-16 LAB — COMPREHENSIVE METABOLIC PANEL WITH GFR
ALT: 30 U/L (ref 0–53)
AST: 26 U/L (ref 0–37)
Albumin: 4.3 g/dL (ref 3.5–5.2)
Alkaline Phosphatase: 62 U/L (ref 39–117)
BUN: 20 mg/dL (ref 6–23)
CO2: 27 meq/L (ref 19–32)
Calcium: 9.5 mg/dL (ref 8.4–10.5)
Chloride: 103 meq/L (ref 96–112)
Creatinine, Ser: 1.01 mg/dL (ref 0.40–1.50)
GFR: 80.76 mL/min
Glucose, Bld: 88 mg/dL (ref 70–99)
Potassium: 4 meq/L (ref 3.5–5.1)
Sodium: 137 meq/L (ref 135–145)
Total Bilirubin: 0.4 mg/dL (ref 0.2–1.2)
Total Protein: 7.8 g/dL (ref 6.0–8.3)

## 2018-05-16 LAB — AMYLASE: Amylase: 36 U/L (ref 27–131)

## 2018-05-16 NOTE — Patient Instructions (Signed)
Good to see you today  Continue to work on good hydration  I will notify you of lab results   Tick Bite Information, Adult Ticks are insects that can bite. Most ticks live in shrubs and grassy areas. They climb onto people and animals that go by. Then they bite. Some ticks carry germs that can make you sick. How can I prevent tick bites?  Use an insect repellent that has 20% or higher of the ingredients DEET, picaridin, or IR3535. Put this insect repellent on: ? Bare skin. ? The tops of your boots. ? Your pant legs. ? The ends of your sleeves.  If you use an insect repellent that has the ingredient permethrin, make sure to follow the instructions on the bottle. Treat the following: ? Clothing. ? Supplies. ? Boots. ? Tents.  Wear long sleeves, long pants, and light colors.  Tuck your pant legs into your socks.  Stay in the middle of the trail.  Try not to walk through long grass.  Before going inside your house, check your clothes, hair, and skin for ticks. Make sure to check your head, neck, armpits, waist, groin, and joint areas.  Check for ticks every day.  When you come indoors: ? Wash your clothes right away. ? Shower right away. ? Dry your clothes in a dryer on high heat for 60 minutes or more. What is the right way to remove a tick? Remove a tick from your skin as soon as possible.  To remove a tick that is crawling on your skin: ? Go outdoors and brush the tick off. ? Use tape or a lint roller.  To remove a tick that is biting: ? Wash your hands. ? If you have latex gloves, put them on. ? Use tweezers, curved forceps, or a tick-removal tool to grasp the tick. Grasp the tick as close to your skin and as close to the tick's head as possible. ? Gently pull up until the tick lets go.  Try to keep the tick's head attached to its body.  Do not twist or jerk the tick.  Do not squeeze or crush the tick.  Do not try to remove a tick with heat, alcohol,  petroleum jelly, or fingernail polish. How should I get rid of a tick? Here are some ways to get rid of a tick that is alive:  Place the tick in rubbing alcohol.  Place the tick in a bag or container you can close tightly.  Wrap the tick tightly in tape.  Flush the tick down the toilet.  Contact a doctor if:  You have symptoms of a disease, such as: ? Pain in a muscle, joint, or bone. ? Trouble walking or moving your legs. ? Numbness in your legs. ? Inability to move (paralysis). ? A red rash that makes a circle (bull's-eye rash). ? Redness and swelling where the tick bit you. ? A fever. ? Throwing up (vomiting) over and over. ? Diarrhea. ? Weight loss. ? Tender and swollen lymph glands. ? Shortness of breath. ? Cough. ? Belly pain (abdominal pain). ? Headache. ? Being more tired than normal. ? A change in how alert (conscious) you are. ? Confusion. Get help right away if:  You cannot remove a tick.  A part of a tick breaks off and gets stuck in your skin.  You are feeling worse. Summary  Ticks may carry germs that can make you sick.  To prevent tick bites, wear long sleeves, long pants,  and light colors. Use insect repellent. Follow the instructions on the bottle.  If the tick is biting, do not try to remove it with heat, alcohol, petroleum jelly, or fingernail polish.  Use tweezers, curved forceps, or a tick-removal tool to grasp the tick. Gently pull up until the tick lets go. Do not twist or jerk the tick. Do not squeeze or crush the tick.  If you have symptoms, contact a doctor. This information is not intended to replace advice given to you by your health care provider. Make sure you discuss any questions you have with your health care provider. Document Released: 01/04/2010 Document Revised: 01/20/2017 Document Reviewed: 01/20/2017 Elsevier Interactive Patient Education  2018 [employer].

## 2018-05-16 NOTE — Progress Notes (Signed)
Subjective:    Patient ID: Lee Beltran, male    DOB: [birth year], 57 y.o.   MRN: [id number]  HPI This is a 57 yo male who presents today with diarrhea. Started having diarrhea end of June, went to [facility] July 1 for diarrhea and lower abdominal pain. Had multiple tick bites most days for last 2 months. He is a logger. He was given doxycycline 100 mg x 10 days. Improvement in diarrhea. Negative stool studies. Finished course. Was better on vacation. Now with 3 days lower abdominal pain, loose, not watery, bowel movements after eating, about 3x day. No blood. No fevers, some sweats at night. Intermittent itching. Areas of redness and itching where ticks removed. Using Off bug spray.  Lower abdominal pain, no nausea or vomiting, comes and goes, achy. No pelvic pain, dysuria, urine frequency.   Past Medical History:  Diagnosis Date  . GERD (gastroesophageal reflux disease)   . Hyperlipidemia    Past Surgical History:  Procedure Laterality Date  . face fracture  1990s   has a plate R cheek  . PATELLAR TENDON REPAIR     Family History  Problem Relation Age of Onset  . CAD Father        F MI age 58, M defibrilator   . Diabetes Brother         B and mother   . Breast cancer Sister   . Esophageal cancer Brother   . Colon cancer Neg Hx   . Prostate cancer Neg Hx    Social History   Tobacco Use  . Smoking status: Never Smoker  . Smokeless tobacco: Never Used  Substance Use Topics  . Alcohol use: No  . Drug use: No      Review of Systems Per HPI    Objective:   Physical Exam  Constitutional: He is oriented to person, place, and time. He appears well-developed and well-nourished.  Non-toxic appearance. He does not appear ill. No distress.  HENT:  Head: Normocephalic and atraumatic.  Cardiovascular: Normal rate, regular rhythm and normal heart sounds.  Pulmonary/Chest: Effort normal and breath sounds normal.  Abdominal: Soft. Normal appearance and bowel sounds are normal.  There is no hepatosplenomegaly. There is tenderness (mild lower abdomen). There is no rigidity, no rebound and no guarding. No hernia.  Neurological: He is alert and oriented to person, place, and time.  Skin: Skin is warm and dry.  Abdomen and lower legs with multiple erythematous papules, abdomen with slightly larger area on right, patient reports he has been scratching this are.  No generalized rash or bullseye.   Psychiatric: He has a normal mood and affect. His behavior is normal.  Vitals reviewed.     BP 128/82   Pulse 61   Temp 98.4 F (36.9 C) (Oral)   Wt 192 lb 12 oz (87.4 kg)   SpO2 97%   BMI 26.88 kg/m  Wt Readings from Last 3 Encounters:  05/16/18 192 lb 12 oz (87.4 kg)  10/06/17 191 lb 4 oz (86.8 kg)  10/19/16 185 lb (83.9 kg)       Assessment & Plan:  1. Tick bite of abdomen, initial encounter - [place] mtn spotted fvr abs pnl(IgG+IgM) - B. burgdorfi antibodies by WB - discussed prevention of bites and provided handout  2. Diarrhea, unspecified type - CBC with Differential - Comprehensive metabolic panel - Amylase - will get records from [facility]  3. Lower abdominal pain - CBC with Differential - Comprehensive metabolic  panel - Amylase   Olean Ree, FNP-BC  [facility], [facility]  05/16/2018 11:32 AM

## 2018-05-17 ENCOUNTER — Encounter: Payer: Self-pay | Admitting: Family Medicine

## 2018-05-18 ENCOUNTER — Encounter: Payer: Self-pay | Admitting: Family Medicine

## 2018-05-18 LAB — B. BURGDORFI ANTIBODIES BY WB
B BURGDORFERI IGM ABS (IB): NEGATIVE
B burgdorferi IgG Abs (IB): NEGATIVE
LYME DISEASE 23 KD IGG: NONREACTIVE
LYME DISEASE 23 KD IGM: NONREACTIVE
LYME DISEASE 39 KD IGM: NONREACTIVE
LYME DISEASE 45 KD IGG: NONREACTIVE
LYME DISEASE 58 KD IGG: NONREACTIVE
Lyme Disease 18 kD IgG: NONREACTIVE
Lyme Disease 28 kD IgG: NONREACTIVE
Lyme Disease 30 kD IgG: NONREACTIVE
Lyme Disease 39 kD IgG: NONREACTIVE
Lyme Disease 41 kD IgG: NONREACTIVE
Lyme Disease 41 kD IgM: NONREACTIVE
Lyme Disease 66 kD IgG: NONREACTIVE
Lyme Disease 93 kD IgG: NONREACTIVE

## 2018-05-18 LAB — ROCKY MTN SPOTTED FVR ABS PNL(IGG+IGM)
RMSF IGG: NOT DETECTED
RMSF IgM: NOT DETECTED

## 2020-12-01 ENCOUNTER — Other Ambulatory Visit: Payer: Self-pay

## 2020-12-01 ENCOUNTER — Encounter (HOSPITAL_COMMUNITY): Payer: Self-pay | Admitting: Emergency Medicine

## 2020-12-01 ENCOUNTER — Ambulatory Visit (HOSPITAL_COMMUNITY)
Admission: EM | Admit: 2020-12-01 | Discharge: 2020-12-01 | Disposition: A | Discharge status: Home / Self Care | Payer: Self-pay | Attending: Family Medicine | Admitting: Family Medicine

## 2020-12-01 DIAGNOSIS — T7840XA Allergy, unspecified, initial encounter: Secondary | ICD-10-CM

## 2020-12-01 DIAGNOSIS — L509 Urticaria, unspecified: Secondary | ICD-10-CM

## 2020-12-01 DIAGNOSIS — R220 Localized swelling, mass and lump, head: Secondary | ICD-10-CM

## 2020-12-01 MED ORDER — TRIAMCINOLONE ACETONIDE 0.1 % EX CREA: 1.0000 "application " | TOPICAL_CREAM | Freq: Two times a day (BID) | CUTANEOUS | 0 refills | Status: DC

## 2020-12-01 MED ORDER — PREDNISONE 10 MG PO TABS: ORAL_TABLET | ORAL | 0 refills | Status: AC

## 2020-12-01 NOTE — ED Triage Notes (Signed)
Pt presents with swelling and itching in top lip that started around 11 pm last night. States also developed a rash bilaterally on shoulders. States rash has gotten better this am. States took ibuprofen and was given some relief.  Denies any SOB, difficulty swallowing, or feeling as if his throat is closing.

## 2020-12-01 NOTE — ED Provider Notes (Signed)
[facility]    CSN: [id number] Arrival date & time: 12/01/20  0831      History   Chief Complaint Chief Complaint  Patient presents with  . Allergic Reaction  . Rash    HPI Lee Beltran is a 60 y.o. male.   Here today with lip and facial swelling that started last night with upper body and back urticarial rash appearing shortly after. States nothing out of the ordinary yesterday aside from missing his daily zyrtec dose for allergic rhinitis. No new supplements, foods, soaps/detergents, exposures in environment, tick bites, etc. Denies N/V, trouble breathing, throat swelling. Sxs are improving mildly at this time but still present. States this has never happened to him before.      Past Medical History:  Diagnosis Date  . GERD (gastroesophageal reflux disease)   . Hyperlipidemia     Patient Active Problem List   Diagnosis Date Noted  . Annual physical exam 04/15/2014  . Cellulitis 02/15/2014  . GERD (gastroesophageal reflux disease)   . Hyperlipidemia     Past Surgical History:  Procedure Laterality Date  . face fracture  1990s   has a plate R cheek  . PATELLAR TENDON REPAIR         Home Medications    Prior to Admission medications   Medication Sig Start Date End Date Taking? Authorizing Provider  predniSONE (DELTASONE) 10 MG tablet Take 6 tabs daily x 2 days, 5 tabs daily x 2 days, 4 tabs daily x 2 days, etc 12/01/20  Yes Particia Nearing, PA-C  triamcinolone (KENALOG) 0.1 % Apply 1 application topically 2 (two) times daily. 12/01/20  Yes Particia Nearing, PA-C  albuterol (PROVENTIL HFA;VENTOLIN HFA) 108 (90 Base) MCG/ACT inhaler Inhale 2 puffs into the lungs every 4 (four) hours as needed for wheezing or shortness of breath (cough, shortness of breath or wheezing.). 10/09/17   Emi Belfast, FNP  ipratropium (ATROVENT) 0.06 % nasal spray Place 2 sprays into both nostrils 4 (four) times daily. 10/19/16   Linna Hoff, MD  Nutritional  Supplements (CHOLESTEROL DEFENSE PO) Take 1 tablet by mouth daily.    [provider]  Specialty Vitamins Products (HEALTHY HEART COMPLEX PO) Take 1 tablet by mouth daily.    [provider]  UNABLE TO FIND Pt takes Shaklee, Nutriferon, 1 tablet a day-nutritional supplement    [provider]    Family History Family History  Problem Relation Age of Onset  . CAD Father        F MI age 39, M defibrilator   . Diabetes Brother         B and mother   . Breast cancer Sister   . Esophageal cancer Brother   . Colon cancer Neg Hx   . Prostate cancer Neg Hx     Social History Social History   Tobacco Use  . Smoking status: Never Smoker  . Smokeless tobacco: Never Used  Substance Use Topics  . Alcohol use: No  . Drug use: No     Allergies   Patient has no known allergies.   Review of Systems Review of Systems PER HPI   Physical Exam Triage Vital Signs ED Triage Vitals  Enc Vitals Group     BP 12/01/20 0851 (!) 156/88     Pulse Rate 12/01/20 0851 97     Resp 12/01/20 0851 18     Temp 12/01/20 0851 98.4 F (36.9 C)     Temp  Source 12/01/20 0851 Oral     SpO2 12/01/20 0851 99 %     Weight --      Height --      Head Circumference --      Peak Flow --      Pain Score 12/01/20 0848 3     Pain Loc --      Pain Edu? --      Excl. in GC? --    No data found.  Updated Vital Signs BP (!) 156/88 (BP Location: Right Arm)   Pulse 97   Temp 98.4 F (36.9 C) (Oral)   Resp 18   SpO2 99%   Visual Acuity Right Eye Distance:   Left Eye Distance:   Bilateral Distance:    Right Eye Near:   Left Eye Near:    Bilateral Near:     Physical Exam Vitals and nursing note reviewed.  Constitutional:      General: He is not in acute distress.    Appearance: Normal appearance.  HENT:     Head: Atraumatic.     Mouth/Throat:     Mouth: Mucous membranes are moist.     Pharynx: Oropharynx is clear. No posterior oropharyngeal erythema.     Comments:  Oropharynx patent Eyes:     Extraocular Movements: Extraocular movements intact.     Conjunctiva/sclera: Conjunctivae normal.  Cardiovascular:     Rate and Rhythm: Normal rate and regular rhythm.  Pulmonary:     Effort: Pulmonary effort is normal. No respiratory distress.     Breath sounds: Normal breath sounds. No wheezing or rales.  Musculoskeletal:        General: Normal range of motion.     Cervical back: Normal range of motion and neck supple.  Skin:    General: Skin is warm and dry.     Findings: Rash (remnants of urticarial rash across b/l shoulders and upper back) present.     Comments: Facial swelling and upper and lower lip swelling  Neurological:     General: No focal deficit present.     Mental Status: He is oriented to person, place, and time.  Psychiatric:        Mood and Affect: Mood normal.        Thought Content: Thought content normal.        Judgment: Judgment normal.     UC Treatments / Results  Labs (all labs ordered are listed, but only abnormal results are displayed) Labs Reviewed - No data to display  EKG   Radiology No results found.  Procedures Procedures (including critical care time)  Medications Ordered in UC Medications - No data to display  Initial Impression / Assessment and Plan / UC Course  I have reviewed the triage vital signs and the nursing notes.  Pertinent labs & imaging results that were available during my care of the patient were reviewed by me and considered in my medical decision making (see chart for details).     Unclear trigger to reaction, will treat with extended prednisone taper, triamcinolone cream prn, and BID antihistamines until resolved. Discussed monitoring all exposures and adding things in slowly to see if can identify trigger. Allergist referral from PCP if returning  Final Clinical Impressions(s) / UC Diagnoses   Final diagnoses:  Urticaria  Allergic reaction, initial encounter  Facial swelling    Discharge Instructions   None    ED Prescriptions    Medication Sig Dispense Auth. Provider   predniSONE (DELTASONE)  10 MG tablet Take 6 tabs daily x 2 days, 5 tabs daily x 2 days, 4 tabs daily x 2 days, etc 42 tablet Particia Nearing, PA-C   triamcinolone (KENALOG) 0.1 % Apply 1 application topically 2 (two) times daily. 60 g Particia Nearing, [place]     PDMP not reviewed this encounter.   Particia Nearing, [place] 12/01/20 1006

## 2020-12-30 ENCOUNTER — Other Ambulatory Visit: Payer: Self-pay

## 2021-06-30 ENCOUNTER — Ambulatory Visit (HOSPITAL_COMMUNITY)
Admission: EM | Admit: 2021-06-30 | Discharge: 2021-06-30 | Disposition: A | Discharge status: Home / Self Care | Payer: 59 | Attending: Physician Assistant | Admitting: Physician Assistant

## 2021-06-30 ENCOUNTER — Encounter (HOSPITAL_COMMUNITY): Payer: Self-pay

## 2021-06-30 ENCOUNTER — Other Ambulatory Visit: Payer: Self-pay

## 2021-06-30 ENCOUNTER — Ambulatory Visit (INDEPENDENT_AMBULATORY_CARE_PROVIDER_SITE_OTHER): Payer: 59

## 2021-06-30 DIAGNOSIS — S99911A Unspecified injury of right ankle, initial encounter: Secondary | ICD-10-CM | POA: Diagnosis not present

## 2021-06-30 DIAGNOSIS — M25571 Pain in right ankle and joints of right foot: Secondary | ICD-10-CM

## 2021-06-30 NOTE — ED Provider Notes (Signed)
[facility]    CSN: [id number] Arrival date & time: 06/30/21  1659      History   Chief Complaint Chief Complaint  Patient presents with   Ankle Pain    HPI Lee Beltran is a 60 y.o. male.   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  He states that he jumped to avoid being knocked off by another piece of equipment.  He reports when he fell he fell onto both feet and felt pain in his right ankle then fell backwards into a mud puddle.  He denies any pain other than his lateral ankle.  No pain on left lower extremity.  He did not hit his head, denies any LOC.  Denies numbness or tingling.  Weightbearing and palpation of right lateral ankle worsens pain.  He has not tried any medication for treatment.  Denies any prior injury to his right ankle  The history is provided by the patient.  Ankle Pain Associated symptoms: no fever    Past Medical History:  Diagnosis Date   GERD (gastroesophageal reflux disease)    Hyperlipidemia     Patient Active Problem List   Diagnosis Date Noted   Annual physical exam 04/15/2014   Cellulitis 02/15/2014   GERD (gastroesophageal reflux disease)    Hyperlipidemia     Past Surgical History:  Procedure Laterality Date   face fracture  1990s   has a plate R cheek   PATELLAR TENDON REPAIR         Home Medications    Prior to Admission medications   Medication Sig Start Date End Date Taking? Authorizing Provider  albuterol (PROVENTIL HFA;VENTOLIN HFA) 108 (90 Base) MCG/ACT inhaler Inhale 2 puffs into the lungs every 4 (four) hours as needed for wheezing or shortness of breath (cough, shortness of breath or wheezing.). 10/09/17   Emi Belfast, FNP  ipratropium (ATROVENT) 0.06 % nasal spray Place 2 sprays into both nostrils 4 (four) times daily. 10/19/16   Linna Hoff, MD  Nutritional Supplements (CHOLESTEROL DEFENSE PO) Take 1 tablet by  mouth daily.    [provider]  predniSONE (DELTASONE) 10 MG tablet Take 6 tabs daily x 2 days, 5 tabs daily x 2 days, 4 tabs daily x 2 days, etc 12/01/20   Particia Nearing, PA-C  Specialty Vitamins Products (HEALTHY HEART COMPLEX PO) Take 1 tablet by mouth daily.    [provider]  triamcinolone (KENALOG) 0.1 % Apply 1 application topically 2 (two) times daily. 12/01/20   Particia Nearing, PA-C  UNABLE TO FIND Pt takes Shaklee, Nutriferon, 1 tablet a day-nutritional supplement    [provider]    Family History Family History  Problem Relation Age of Onset   CAD Father        F MI age 21, M defibrilator    Diabetes Brother         B and mother    Breast cancer Sister    Esophageal cancer Brother    Colon cancer Neg Hx    Prostate cancer Neg Hx     Social History Social History   Tobacco Use   Smoking status: Never   Smokeless tobacco: Never  Substance Use Topics   Alcohol use: No   Drug use: No     Allergies   Patient has no known allergies.   Review of Systems Review  of Systems  Constitutional:  Negative for chills and fever.  Eyes:  Negative for discharge and redness.  Respiratory:  Negative for shortness of breath.   Musculoskeletal:  Positive for arthralgias and joint swelling.  Skin:  Negative for color change.  Neurological:  Negative for numbness.    Physical Exam Triage Vital Signs ED Triage Vitals  Enc Vitals Group     BP 06/30/21 1806 (!) 180/149     Pulse Rate 06/30/21 1806 84     Resp 06/30/21 1806 18     Temp 06/30/21 1806 98.7 F (37.1 C)     Temp Source 06/30/21 1806 Oral     SpO2 06/30/21 1806 96 %     Weight --      Height --      Head Circumference --      Peak Flow --      Pain Score 06/30/21 1808 6     Pain Loc --      Pain Edu? --      Excl. in GC? --    No data found.  Updated Vital Signs BP (!) 160/94 (BP Location: Right Arm)   Pulse 84   Temp 98.7 F (37.1 C) (Oral)   Resp 18    SpO2 96%   Visual Acuity Right Eye Distance:   Left Eye Distance:   Bilateral Distance:    Right Eye Near:   Left Eye Near:    Bilateral Near:     Physical Exam Vitals and nursing note reviewed.  Constitutional:      General: He is not in acute distress.    Appearance: Normal appearance. He is not ill-appearing.  HENT:     Head: Normocephalic and atraumatic.     Nose: Nose normal.  Eyes:     Conjunctiva/sclera: Conjunctivae normal.  Cardiovascular:     Rate and Rhythm: Normal rate.  Pulmonary:     Effort: Pulmonary effort is normal.  Musculoskeletal:        General: Swelling and tenderness present.     Comments: Normal range of motion of right knee and right toes.  Decreased range of motion of right ankle due to pain.  Tenderness to palpation noted to lateral malleolus of right ankle.  No tenderness to palpation noted to right foot or right medial ankle.  Minimal swelling noted to the left lateral malleolus.  No appreciated bruising or erythema.  Skin:    General: Skin is warm and dry.     Capillary Refill: Normal cap refill to right toes. Neurological:     Mental Status: He is alert.     Comments: Gross sensation intact to distal right toes.  Psychiatric:        Mood and Affect: Mood normal.        Thought Content: Thought content normal.     UC Treatments / Results  Labs (all labs ordered are listed, but only abnormal results are displayed) Labs Reviewed - No data to display  EKG   Radiology DG Ankle Complete Right  Result Date: 06/30/2021 CLINICAL DATA:  Right ankle injury after jumping from a platform this afternoon EXAM: RIGHT ANKLE - COMPLETE 3+ VIEW COMPARISON:  None. FINDINGS: Plafond and talar dome appear intact as do the malleoli. No soft tissue swelling overlying the malleoli. Os peroneus noted. Plantar and Achilles calcaneal spurs. Boehler's angle appears normal. No obvious cortical discontinuity is identified along the calcaneus, talus, or visualized  midfoot. IMPRESSION: 1. No acute bony findings. If  there is a high clinical suspicion of occult injury or if the patient will not bear weight, consider dedicated CT scan. Electronically Signed   By: Gaylyn Rong M.D.   On: 06/30/2021 18:52    Procedures Procedures (including critical care time)  Medications Ordered in UC Medications - No data to display  Initial Impression / Assessment and Plan / UC Course  I have reviewed the triage vital signs and the nursing notes.  Pertinent labs & imaging results that were available during my care of the patient were reviewed by me and considered in my medical decision making (see chart for details).  X-ray ordered of right ankle with no acute fracture noted.  Recommend ibuprofen for pain, continued monitoring of symptoms.  Encouraged follow-up with PCP or Ortho should his ankle continue to give him issues over the next week.  He may need repeat imaging at that time.  Final Clinical Impressions(s) / UC Diagnoses   Final diagnoses:  Acute right ankle pain  Injury of right ankle, initial encounter     Discharge Instructions      Use ibuprofen if needed for pain. If no improvement over the next 7 days recommend follow up with PCP or ortho.      ED Prescriptions   None    PDMP not reviewed this encounter.   Tomi Bamberger, PA-C 06/30/21 1919

## 2021-06-30 NOTE — ED Triage Notes (Signed)
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

## 2021-06-30 NOTE — Discharge Instructions (Addendum)
Use ibuprofen if needed for pain. If no improvement over the next 7 days recommend follow up with PCP or ortho.

## 2021-08-05 ENCOUNTER — Ambulatory Visit (INDEPENDENT_AMBULATORY_CARE_PROVIDER_SITE_OTHER): Payer: 59 | Admitting: Infectious Disease

## 2021-08-05 ENCOUNTER — Encounter: Payer: Self-pay | Admitting: Infectious Disease

## 2021-08-05 ENCOUNTER — Other Ambulatory Visit: Payer: Self-pay

## 2021-08-05 VITALS — BP 152/80 | HR 76 | Temp 98.0°F | Ht 71.0 in | Wt 196.0 lb

## 2021-08-05 DIAGNOSIS — L0292 Furuncle, unspecified: Secondary | ICD-10-CM | POA: Diagnosis not present

## 2021-08-05 DIAGNOSIS — A4902 Methicillin resistant Staphylococcus aureus infection, unspecified site: Secondary | ICD-10-CM

## 2021-08-05 DIAGNOSIS — Z23 Encounter for immunization: Secondary | ICD-10-CM

## 2021-08-05 DIAGNOSIS — L249 Irritant contact dermatitis, unspecified cause: Secondary | ICD-10-CM | POA: Diagnosis not present

## 2021-08-05 DIAGNOSIS — L259 Unspecified contact dermatitis, unspecified cause: Secondary | ICD-10-CM

## 2021-08-05 HISTORY — DX: Unspecified contact dermatitis, unspecified cause: L25.9

## 2021-08-05 HISTORY — DX: Furuncle, unspecified: L02.92

## 2021-08-05 MED ORDER — TRIAMCINOLONE ACETONIDE 0.5 % EX OINT: 1.0000 "application " | TOPICAL_OINTMENT | Freq: Two times a day (BID) | CUTANEOUS | 3 refills | Status: AC

## 2021-08-05 NOTE — Progress Notes (Signed)
Subjective:  Reason for infectious disease consult: Recurrent boils and MRSA infection  Requesting physician: Betsey Holiday, PA   Patient ID: Lee Beltran, male    DOB: 1961-08-01, 60 y.o.   MRN: [id number]  HPI  Lee Beltran is a 31 Caucasian man with a history of hyperlipidemia gastroesophageal reflux disease whose wife saw me for recurrent MRSA infections and she came to clinic again today and brought him along as well.  He has been suffering from recurrent boils typically near his knees but also sometimes in his inguinal area sometimes behind his buttocks.  He also appears to have a type of irritant contact dermatitis that shows up in areas where he sweats frequently such as behind his neck or currently underneath his right breast and left arm.  These areas have responded to topical corticosteroids but he has not been on any recently.  Both he and his wife did a decolonization regimen that consisted of systemic oral doxycycline twice daily for [redacted] weeks along with intranasal bacitracin and Hibiclens baths with avoidance of physical contact between the 2 of them.  Since they have implemented this decolonization regimen neither one of them has had any outbreaks of boils or MRSA infection.  He does have some areas of dermatitis that are present currently.  I think would be prudent for these things to be well controlled because if he scratches them it is a good way of him inoculating himself with bacteria including MRSA.  I did counsel him about the fact that steroids are immunosuppressive drugs and they can thin the skin but I think being used judiciously here they could be helpful.   Past Medical History:  Diagnosis Date   Boils 08/05/2021   Contact dermatitis 08/05/2021   GERD (gastroesophageal reflux disease)    Hyperlipidemia     Past Surgical History:  Procedure Laterality Date   face fracture  1990s   has a plate R cheek   PATELLAR TENDON REPAIR      Family History   Problem Relation Age of Onset   CAD Father        F MI age 54, M defibrilator    Diabetes Brother         B and mother    Breast cancer Sister    Esophageal cancer Brother    Colon cancer Neg Hx    Prostate cancer Neg Hx       Social History   Socioeconomic History   Marital status: Married    Spouse name: Lee Beltran   Number of children: 1   Years of education: Not on file   Highest education level: Not on file  Occupational History   Occupation: Armed forces training and education officer: [employer]  Tobacco Use   Smoking status: Never   Smokeless tobacco: Never  Vaping Use   Vaping Use: Never used  Substance and Sexual Activity   Alcohol use: No   Drug use: No   Sexual activity: Yes    Partners: Female  Other Topics Concern   Not on file  Social History Narrative   Not on file   Social Determinants of Health   Financial Resource Strain: Not on file  Food Insecurity: Not on file  Transportation Needs: Not on file  Physical Activity: Not on file  Stress: Not on file  Social Connections: Not on file    No Known Allergies   Current Outpatient Medications:    albuterol (PROVENTIL HFA;VENTOLIN HFA) 108 (  90 Base) MCG/ACT inhaler, Inhale 2 puffs into the lungs every 4 (four) hours as needed for wheezing or shortness of breath (cough, shortness of breath or wheezing.)., Disp: 1 Inhaler, Rfl: 1   ipratropium (ATROVENT) 0.06 % nasal spray, Place 2 sprays into both nostrils 4 (four) times daily., Disp: 15 mL, Rfl: 1   Nutritional Supplements (CHOLESTEROL DEFENSE PO), Take 1 tablet by mouth daily., Disp: , Rfl:    predniSONE (DELTASONE) 10 MG tablet, Take 6 tabs daily x 2 days, 5 tabs daily x 2 days, 4 tabs daily x 2 days, etc, Disp: 42 tablet, Rfl: 0   Specialty Vitamins Products (HEALTHY HEART COMPLEX PO), Take 1 tablet by mouth daily., Disp: , Rfl:    triamcinolone ointment (KENALOG) 0.5 %, Apply 1 application topically 2 (two) times daily., Disp: 30 g, Rfl: 3   UNABLE  TO FIND, Pt takes Shaklee, Nutriferon, 1 tablet a day-nutritional supplement, Disp: , Rfl:    Review of Systems  Constitutional:  Negative for activity change, appetite change, chills, diaphoresis, fatigue, fever and unexpected weight change.  HENT:  Negative for congestion, rhinorrhea, sinus pressure, sneezing, sore throat and trouble swallowing.   Eyes:  Negative for photophobia and visual disturbance.  Respiratory:  Negative for cough, chest tightness, shortness of breath, wheezing and stridor.   Cardiovascular:  Negative for chest pain, palpitations and leg swelling.  Gastrointestinal:  Negative for abdominal distention, abdominal pain, anal bleeding, blood in stool, constipation, diarrhea, nausea and vomiting.  Genitourinary:  Negative for difficulty urinating, dysuria, flank pain and hematuria.  Musculoskeletal:  Negative for arthralgias, back pain, gait problem, joint swelling and myalgias.  Skin:  Positive for rash. Negative for color change, pallor and wound.  Allergic/Immunologic: Positive for environmental allergies.  Neurological:  Negative for dizziness, tremors, weakness and light-headedness.  Hematological:  Negative for adenopathy. Does not bruise/bleed easily.  Psychiatric/Behavioral:  Negative for agitation, behavioral problems, confusion, decreased concentration, dysphoric mood and sleep disturbance.       Objective:   Physical Exam Constitutional:      Appearance: He is well-developed.  HENT:     Head: Normocephalic and atraumatic.  Eyes:     Conjunctiva/sclera: Conjunctivae normal.  Cardiovascular:     Rate and Rhythm: Normal rate and regular rhythm.  Pulmonary:     Effort: Pulmonary effort is normal. No respiratory distress.     Breath sounds: No wheezing.  Abdominal:     General: There is no distension.     Palpations: Abdomen is soft.  Musculoskeletal:        General: No tenderness. Normal range of motion.     Cervical back: Normal range of motion and neck  supple.  Skin:    General: Skin is warm and dry.     Coloration: Skin is not pale.     Findings: No erythema or rash.  Neurological:     General: No focal deficit present.     Mental Status: He is alert and oriented to person, place, and time.  Psychiatric:        Mood and Affect: Mood normal.        Behavior: Behavior normal.        Thought Content: Thought content normal.        Judgment: Judgment normal.    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  Neither 1 has had any recurrence since then.  Contact dermatitis: I have prescribed him triamcinolone 0.5% that he can apply twice daily to these areas that itch.  Vaccine counseling we gave him a shot of influenza.  He and his wife wait to do there bivalent booster together  I spent 42  minutes with the patient including than 50% of the time in face to face counseling of the patient the nature of MRSA colonization infection dermatitis counseling regarding influenza and COVID-19 vaccination along with review of medical records in preparation for the visit and during the visit and in coordination of his care.

## 2022-09-09 IMAGING — DX DG ANKLE COMPLETE 3+V*R*
3 series · 3 of 3 positions shown · non-contrast
Comparison: None.

CLINICAL DATA: Right ankle injury after jumping from a platform
this afternoon

EXAM:
RIGHT ANKLE - COMPLETE 3+ VIEW

[ankle ap]
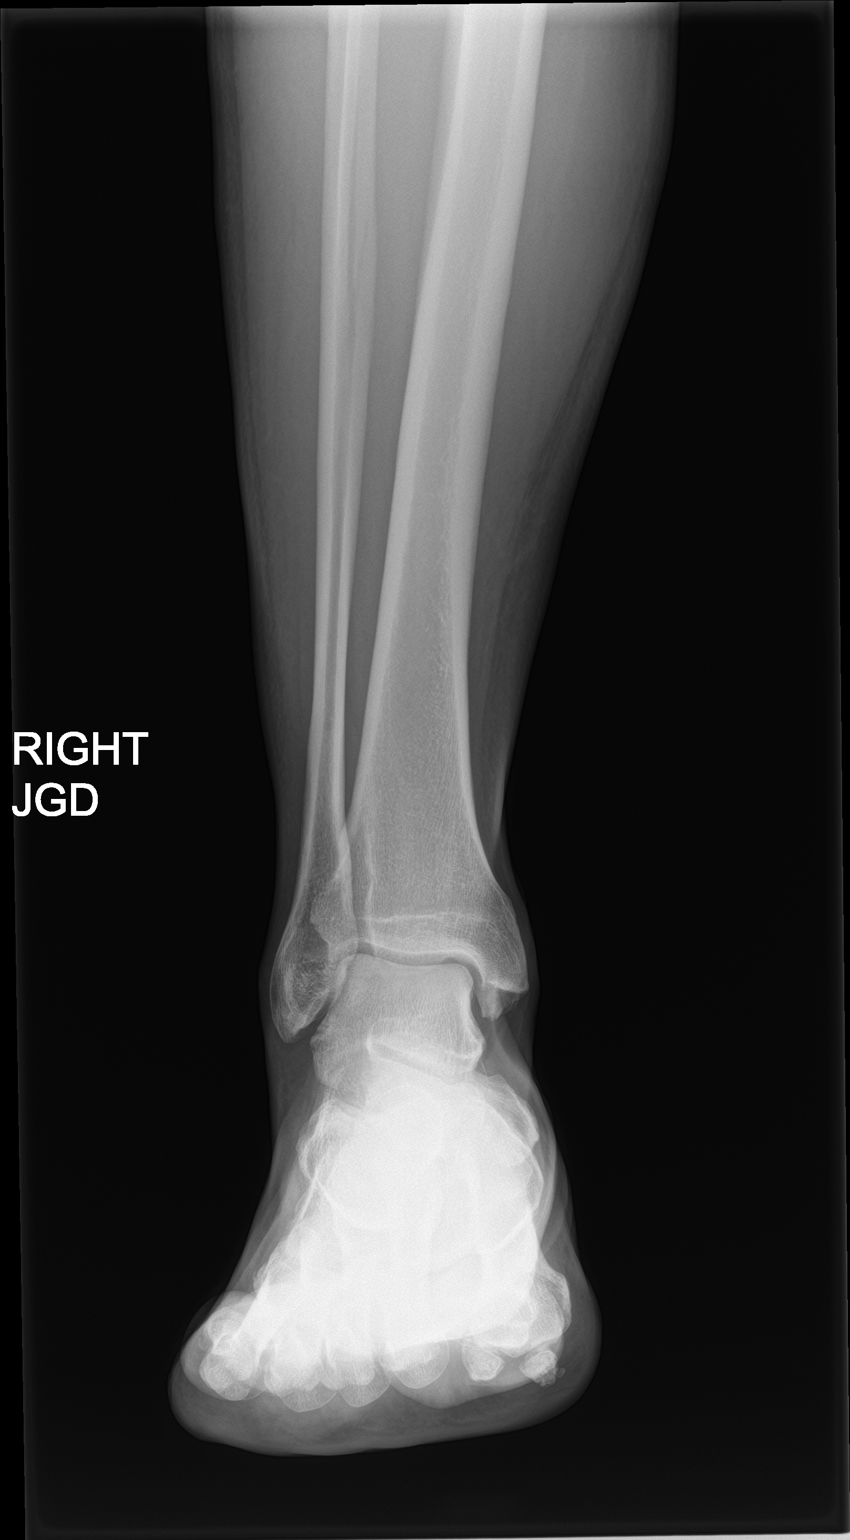

[ankle obl]
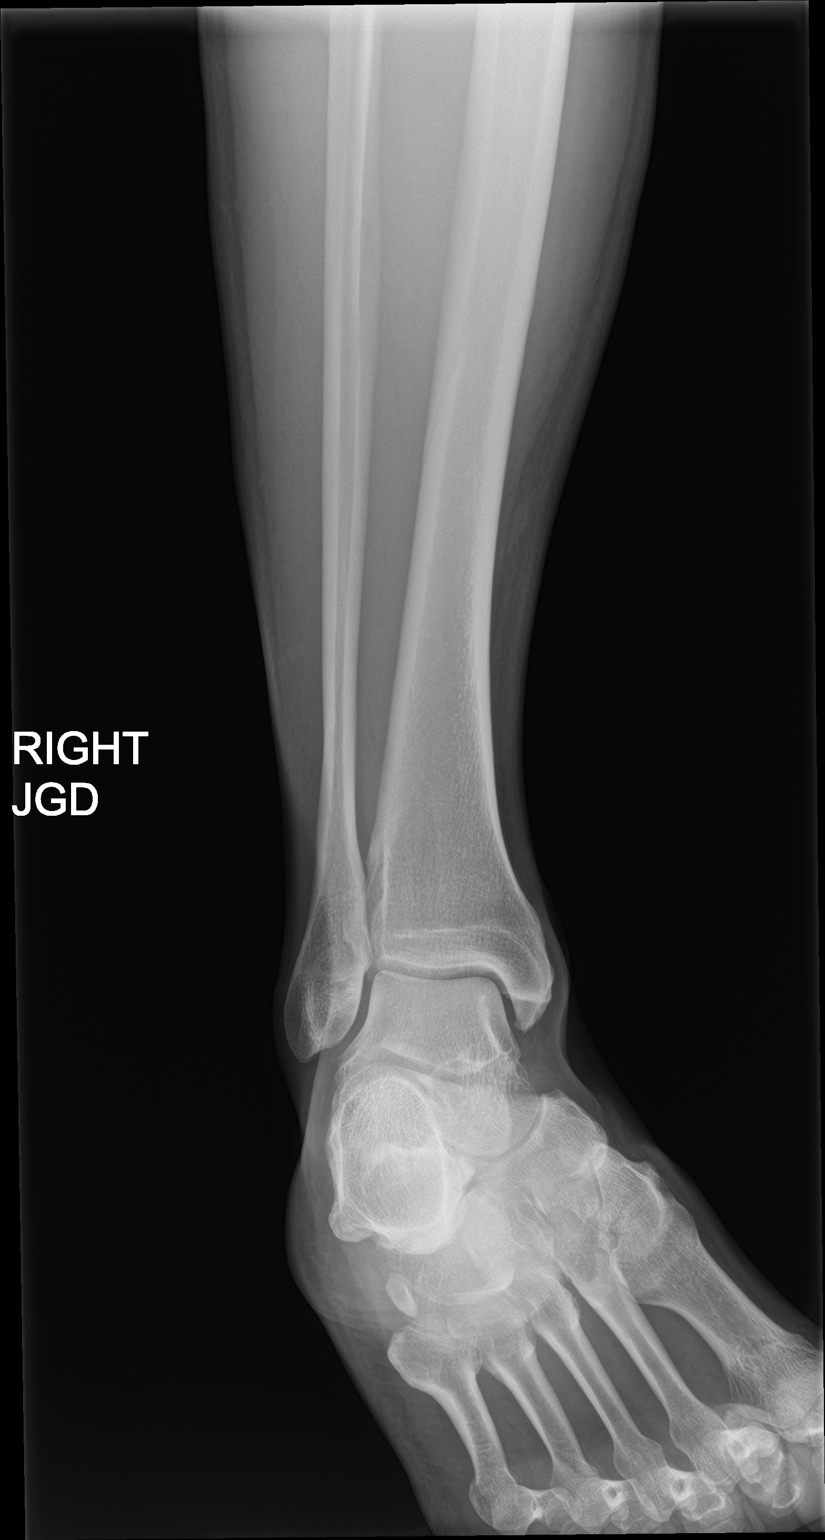

[ankle lat]
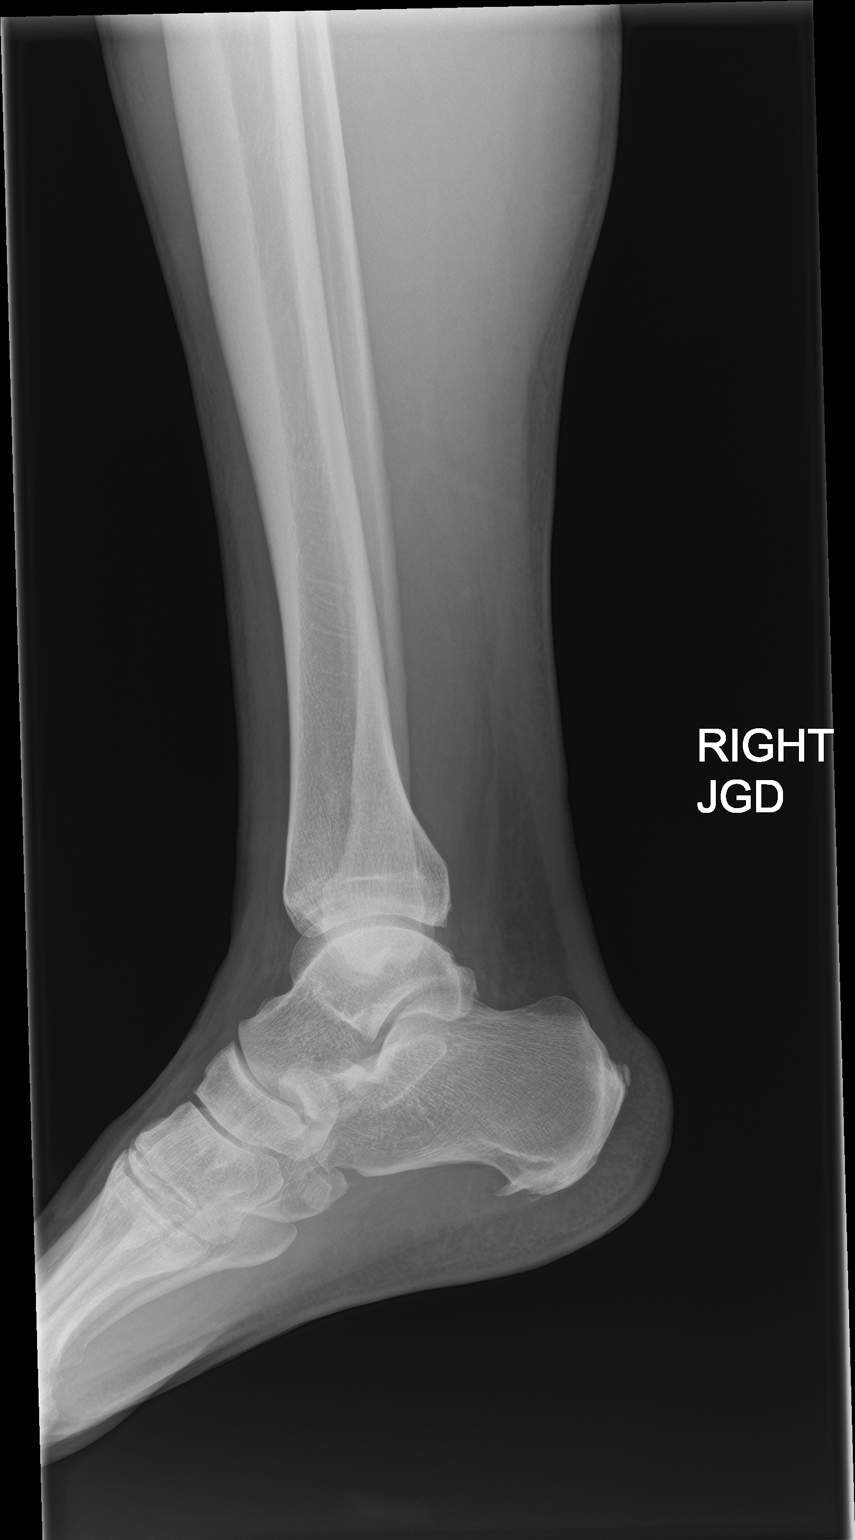

[3 of 3 positions shown; findings below may reference images not displayed]

FINDINGS: Plafond and talar dome appear intact as do the malleoli. No soft
tissue swelling overlying the malleoli. Os peroneus noted.

Plantar and Achilles calcaneal spurs. Boehler's angle appears
normal. No obvious cortical discontinuity is identified along the
calcaneus, talus, or visualized midfoot.
IMPRESSION: 1. No acute bony findings. If there is a high clinical suspicion of
occult injury or if the patient will not bear weight, consider
dedicated CT scan.

## 2023-06-27 DIAGNOSIS — R251 Tremor, unspecified: Secondary | ICD-10-CM | POA: Diagnosis not present

## 2023-06-27 DIAGNOSIS — E785 Hyperlipidemia, unspecified: Secondary | ICD-10-CM | POA: Diagnosis not present

## 2023-06-27 DIAGNOSIS — R030 Elevated blood-pressure reading, without diagnosis of hypertension: Secondary | ICD-10-CM | POA: Diagnosis not present

## 2023-06-27 DIAGNOSIS — E663 Overweight: Secondary | ICD-10-CM | POA: Diagnosis not present

## 2023-06-27 DIAGNOSIS — Z0000 Encounter for general adult medical examination without abnormal findings: Secondary | ICD-10-CM | POA: Diagnosis not present

## 2023-06-27 DIAGNOSIS — Z79899 Other long term (current) drug therapy: Secondary | ICD-10-CM | POA: Diagnosis not present

## 2023-06-27 DIAGNOSIS — K219 Gastro-esophageal reflux disease without esophagitis: Secondary | ICD-10-CM | POA: Diagnosis not present

## 2023-06-27 DIAGNOSIS — Z1211 Encounter for screening for malignant neoplasm of colon: Secondary | ICD-10-CM | POA: Diagnosis not present

## 2023-06-27 DIAGNOSIS — Z23 Encounter for immunization: Secondary | ICD-10-CM | POA: Diagnosis not present

## 2023-06-27 DIAGNOSIS — Z125 Encounter for screening for malignant neoplasm of prostate: Secondary | ICD-10-CM | POA: Diagnosis not present

## 2023-06-27 DIAGNOSIS — S161XXA Strain of muscle, fascia and tendon at neck level, initial encounter: Secondary | ICD-10-CM | POA: Diagnosis not present

## 2023-06-27 DIAGNOSIS — R739 Hyperglycemia, unspecified: Secondary | ICD-10-CM | POA: Diagnosis not present

## 2023-08-28 DIAGNOSIS — R972 Elevated prostate specific antigen [PSA]: Secondary | ICD-10-CM | POA: Diagnosis not present

## 2023-08-28 DIAGNOSIS — R748 Abnormal levels of other serum enzymes: Secondary | ICD-10-CM | POA: Diagnosis not present
